# Patient Record
Sex: Female | Born: 1985 | Race: Black or African American | Hispanic: No | Marital: Single | Smoking: Never smoker
Health system: Southern US, Community
[De-identification: ages and names within clinical notes are randomized; demographics above are authoritative.]

## PROBLEM LIST (undated history)

## (undated) DIAGNOSIS — K219 Gastro-esophageal reflux disease without esophagitis: Secondary | ICD-10-CM

## (undated) DIAGNOSIS — K589 Irritable bowel syndrome without diarrhea: Secondary | ICD-10-CM

## (undated) DIAGNOSIS — D649 Anemia, unspecified: Secondary | ICD-10-CM

## (undated) HISTORY — PX: UPPER GASTROINTESTINAL ENDOSCOPY: SHX188

## (undated) HISTORY — DX: Irritable bowel syndrome without diarrhea: K58.9

## (undated) HISTORY — DX: Gastro-esophageal reflux disease without esophagitis: K21.9

## (undated) HISTORY — DX: Anemia, unspecified: D64.9

---

## 2006-11-24 DIAGNOSIS — C8590 Non-Hodgkin lymphoma, unspecified, unspecified site: Secondary | ICD-10-CM

## 2006-11-24 HISTORY — DX: Non-Hodgkin lymphoma, unspecified, unspecified site: C85.90

## 2020-06-15 ENCOUNTER — Encounter: Payer: Self-pay | Admitting: Gastroenterology

## 2020-07-24 ENCOUNTER — Encounter: Payer: Self-pay | Admitting: Gastroenterology

## 2020-07-24 ENCOUNTER — Ambulatory Visit (INDEPENDENT_AMBULATORY_CARE_PROVIDER_SITE_OTHER): Payer: BC Managed Care – PPO | Admitting: Gastroenterology

## 2020-07-24 VITALS — BP 132/84 | HR 79 | Ht 63.5 in | Wt 232.0 lb

## 2020-07-24 DIAGNOSIS — R112 Nausea with vomiting, unspecified: Secondary | ICD-10-CM

## 2020-07-24 DIAGNOSIS — R1013 Epigastric pain: Secondary | ICD-10-CM | POA: Insufficient documentation

## 2020-07-24 DIAGNOSIS — K219 Gastro-esophageal reflux disease without esophagitis: Secondary | ICD-10-CM

## 2020-07-24 NOTE — Progress Notes (Signed)
Assessment and plan noted ?

## 2020-07-24 NOTE — Progress Notes (Signed)
07/24/2020 Lynelle Doctor [id number] 09-14-1986   HISTORY OF PRESENT ILLNESS: This is a 34 year old female who is new to our office.  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  She is here today with complaints of epigastric abdominal pain with episodes of nausea and vomiting and burning sensation in her chest.  She says that this all started the beginning of March.  She says that she gets these episodes at least twice a month since then.  She says that she tried prescription for Prilosec earlier this year and it seemed like it was working for a while, but then symptoms started again.  She says that 2 weeks ago she was in the ER in [place], [place].  She says that she had a CT scan performed and she was told that it showed thickening of the lining of her stomach.  Currently she is only on Pepcid 20 mg twice daily.  She reports having some type of acid reflux issues in the past, greater than 10 years ago and had an endoscopy performed at that time, but from what she recalls it was unremarkable and she was not placed on any medications.  She denies NSAID use.   Past Medical History:  Diagnosis Date  . Anemia   . GERD (gastroesophageal reflux disease)   . IBS (irritable bowel syndrome)   . Non Hodgkin's lymphoma ([place]) 2008   Past Surgical History:  Procedure Laterality Date  . CESAREAN SECTION      reports that she has never smoked. She has never used smokeless tobacco. She reports that she does not use drugs. No history on file for alcohol use. family history includes Breast cancer in her maternal grandmother and mother; Diabetes in her mother; Heart disease in her father and paternal grandfather; Irritable bowel syndrome in her sister; Kidney disease in her father and maternal grandfather; Lung cancer in her paternal grandmother; Pancreatic cancer in her paternal aunt. No Known Allergies    Outpatient Encounter Medications as of 07/24/2020  Medication Sig  . famotidine (PEPCID) 20 MG tablet Take 20 mg  by mouth 2 (two) times daily.   No facility-administered encounter medications on file as of 07/24/2020.     REVIEW OF SYSTEMS  : All other systems reviewed and negative except where noted in the History of Present Illness.   PHYSICAL EXAM: BP 132/84   Pulse 79   Ht 5' 3.5" (1.613 m)   Wt 232 lb (105.2 kg)   BMI 40.45 kg/m  General: Well developed AA female in no acute distress Head: Normocephalic and atraumatic Eyes:  Sclerae anicteric, conjunctiva pink. Ears: Normal auditory acuity Lungs: Clear throughout to auscultation; no increased WOB. Heart: Regular rate and rhythm; no M/R/G. Abdomen: Soft, non-distended.  BS present.  Epigastric TTP. Musculoskeletal: Symmetrical with no gross deformities  Skin: No lesions on visible extremities Extremities: No edema  Neurological: Alert oriented x 4, grossly non-focal Psychological:  Alert and cooperative. Normal mood and affect  ASSESSMENT AND PLAN: *34 year old female with complaints of epigastric abdominal pain with episodes of nausea and vomiting as well as burning in her chest/reflux for the past 6 months.  No improvement on prescription Prilosec.  Now on Pepcid 20 mg twice daily.  Reports having a CT scan at [facility] in [place], [place].  She says she was told that it showed thickening of her stomach lining.  We will plan for EGD with Dr. Henrene Pastor.  She is scheduled for next week so she would prefer to hold off  on trying any other medications in the interim, but likely will need to try another PPI such as pantoprazole, etc.  The risks, benefits, and alternatives to EGD were discussed with the patient and she consents to proceed.   CC:  No ref. provider found

## 2020-07-24 NOTE — Patient Instructions (Addendum)
If you are age 36 or older, your body mass index should be between 23-30. Your Body mass index is 40.45 kg/m. If this is out of the aforementioned range listed, please consider follow up with your Primary Care Provider.  If you are age 44 or younger, your body mass index should be between 19-25. Your Body mass index is 40.45 kg/m. If this is out of the aformentioned range listed, please consider follow up with your Primary Care Provider.   You have been scheduled for an endoscopy. Please follow written instructions given to you at your visit today. If you use inhalers (even only as needed), please bring them with you on the day of your procedure.  Follow up pending your Endoscopy.

## 2020-07-31 ENCOUNTER — Other Ambulatory Visit: Payer: Self-pay

## 2020-07-31 ENCOUNTER — Ambulatory Visit (AMBULATORY_SURGERY_CENTER): Payer: BC Managed Care – PPO | Admitting: Internal Medicine

## 2020-07-31 ENCOUNTER — Encounter: Payer: Self-pay | Admitting: Internal Medicine

## 2020-07-31 VITALS — BP 126/82 | HR 84 | Temp 98.4°F | Resp 20 | Ht 63.0 in | Wt 232.0 lb

## 2020-07-31 DIAGNOSIS — K253 Acute gastric ulcer without hemorrhage or perforation: Secondary | ICD-10-CM | POA: Diagnosis not present

## 2020-07-31 DIAGNOSIS — K219 Gastro-esophageal reflux disease without esophagitis: Secondary | ICD-10-CM | POA: Diagnosis not present

## 2020-07-31 DIAGNOSIS — R112 Nausea with vomiting, unspecified: Secondary | ICD-10-CM

## 2020-07-31 DIAGNOSIS — R1013 Epigastric pain: Secondary | ICD-10-CM

## 2020-07-31 DIAGNOSIS — K449 Diaphragmatic hernia without obstruction or gangrene: Secondary | ICD-10-CM | POA: Diagnosis not present

## 2020-07-31 DIAGNOSIS — K222 Esophageal obstruction: Secondary | ICD-10-CM | POA: Diagnosis not present

## 2020-07-31 MED ORDER — SODIUM CHLORIDE 0.9 % IV SOLN: 500.0000 mL | Freq: Once | INTRAVENOUS | Status: DC

## 2020-07-31 MED ORDER — PANTOPRAZOLE SODIUM 40 MG PO TBEC: 40.0000 mg | DELAYED_RELEASE_TABLET | Freq: Two times a day (BID) | ORAL | 11 refills | Status: DC

## 2020-07-31 NOTE — Progress Notes (Signed)
Pt reported a slight pain and tingling on her bottom lip. RN assessed and there was a small open are on her lip that looked to be a tooth mark. MD evaluated the area as well and told the pt it did appear that she may have bit her lip during the procedure. No further actions needed at this time. Pt stable for discharge.

## 2020-07-31 NOTE — Progress Notes (Signed)
VS taken by C.W. 

## 2020-07-31 NOTE — Patient Instructions (Signed)
Handouts provided on gastritis and hiatal hernia.   Follow reflux precautions (see hiatal hernia handout).  Start Pantoprazole 40mg  tablet twice daily. (Stop taking Pepcid.)  My office will schedule an abdominal ultrasound to evaluate your recurrent epigastric pain with nausea and vomiting. We will contact you with the appointment details.  Office follow-up with Dr. Henrene Pastor in 4 to 6 weeks.   YOU HAD AN ENDOSCOPIC PROCEDURE TODAY AT [facility]:   Refer to the procedure report that was given to you for any specific questions about what was found during the examination.  If the procedure report does not answer your questions, please call your gastroenterologist to clarify.  If you requested that your care partner not be given the details of your procedure findings, then the procedure report has been included in a sealed envelope for you to review at your convenience later.  YOU SHOULD EXPECT: Some feelings of bloating in the abdomen. Passage of more gas than usual.  Walking can help get rid of the air that was put into your GI tract during the procedure and reduce the bloating. If you had a lower endoscopy (such as a colonoscopy or flexible sigmoidoscopy) you may notice spotting of blood in your stool or on the toilet paper. If you underwent a bowel prep for your procedure, you may not have a normal bowel movement for a few days.  Please Note:  You might notice some irritation and congestion in your nose or some drainage.  This is from the oxygen used during your procedure.  There is no need for concern and it should clear up in a day or so.  SYMPTOMS TO REPORT IMMEDIATELY:   Following upper endoscopy (EGD)  Vomiting of blood or coffee ground material  New chest pain or pain under the shoulder blades  Painful or persistently difficult swallowing  New shortness of breath  Fever of 100F or higher  Black, tarry-looking stools  For urgent or emergent issues, a gastroenterologist  can be reached at any hour by calling [phone]. Do not use MyChart messaging for urgent concerns.    DIET:  We do recommend a small meal at first, but then you may proceed to your regular diet.  Drink plenty of fluids but you should avoid alcoholic beverages for 24 hours.  ACTIVITY:  You should plan to take it easy for the rest of today and you should NOT DRIVE or use heavy machinery until tomorrow (because of the sedation medicines used during the test).    FOLLOW UP: Our staff will call the number listed on your records 48-72 hours following your procedure to check on you and address any questions or concerns that you may have regarding the information given to you following your procedure. If we do not reach you, we will leave a message.  We will attempt to reach you two times.  During this call, we will ask if you have developed any symptoms of COVID 19. If you develop any symptoms (ie: fever, flu-like symptoms, shortness of breath, cough etc.) before then, please call [phone].  If you test positive for Covid 19 in the 2 weeks post procedure, please call and report this information to [place].    If any biopsies were taken you will be contacted by phone or by letter within the next 1-3 weeks.  Please call us at [phone] if you have not heard about the biopsies in 3 weeks.    SIGNATURES/CONFIDENTIALITY: You and/or your care partner  have signed paperwork which will be entered into your electronic medical record.  These signatures attest to the fact that that the information above on your After Visit Summary has been reviewed and is understood.  Full responsibility of the confidentiality of this discharge information lies with you and/or your care-partner.

## 2020-07-31 NOTE — Op Note (Signed)
[facility] Patient Name: Betty Buckley Procedure Date: 07/31/2020 11:12 AM MRN: [id number] Endoscopist: Docia Chuck. Betty Buckley , MD Age: 34 Referring MD:  Date of Birth: 1986/04/10 Gender: Female Account #: [id number] Procedure:                Upper GI endoscopy with biopsies Indications:              Epigastric abdominal pain, Heartburn, Nausea with                            vomiting Medicines:                Monitored Anesthesia Care Procedure:                Pre-Anesthesia Assessment:                           - Prior to the procedure, a History and Physical                            was performed, and patient medications and                            allergies were reviewed. The patient's tolerance of                            previous anesthesia was also reviewed. The risks                            and benefits of the procedure and the sedation                            options and risks were discussed with the patient.                            All questions were answered, and informed consent                            was obtained. Prior Anticoagulants: The patient has                            taken no previous anticoagulant or antiplatelet                            agents. ASA Grade Assessment: II - A patient with                            mild systemic disease. After reviewing the risks                            and benefits, the patient was deemed in                            satisfactory condition to undergo the procedure.  After obtaining informed consent, the endoscope was                            passed under direct vision. Throughout the                            procedure, the patient's blood pressure, pulse, and                            oxygen saturations were monitored continuously. The                            Endoscope was introduced through the mouth, and                            advanced to the second part of  duodenum. The upper                            GI endoscopy was accomplished without difficulty.                            The patient tolerated the procedure well. Scope In: Scope Out: Findings:                 The esophagus revealed a large caliber distal                            esophageal ring. Esophagus was otherwise normal.                           The stomach revealed a moderate sliding hiatal                            hernia. As well, several antral erosions. Biopsies                            were taken with a cold forceps for Helicobacter                            pylori testing using CLOtest.                           The examined duodenum was normal.                           The cardia and gastric fundus were normal on                            retroflexion, save hiatal hernia. Complications:            No immediate complications. Estimated Blood Loss:     Estimated blood loss: none. Impression:               1. Distal esophageal ring  2. Moderate hiatal hernia                           3. Gastric erosions. Status post CLO biopsy                           4. GERD                           5. Recurrent epigastric pain with nausea vomiting. Recommendation:           1. Patient has a contact number available for                            emergencies. The signs and symptoms of potential                            delayed complications were discussed with the                            patient. Return to normal activities tomorrow.                            Written discharge instructions were provided to the                            patient.                           2. Resume previous diet.                           3. Prescribe pantoprazole 40 mg twice daily; #60;                            11 refills                           4. Reflux precautions. The nurse will provide you                            information on gastroesophageal  reflux disease as                            well as reflux precautions, for your review.                           5. Await pathology results.                           6. Schedule abdominal ultrasound. "Recurrent                            epigastric pain with nausea and vomiting, evaluate"                           7. Office follow-up with Dr.  Mattix Imhof in 4 to 6 weeks Betty Lottes N. Betty Pastor, MD 07/31/2020 11:39:38 AM This report has been signed electronically.

## 2020-07-31 NOTE — Progress Notes (Signed)
Called to room to assist during endoscopic procedure.  Patient ID and intended procedure confirmed with present staff. Received instructions for my participation in the procedure from the performing physician.  

## 2020-07-31 NOTE — Progress Notes (Signed)
PT taken to PACU. Monitors in place. VSS. Report given to RN. 

## 2020-08-01 ENCOUNTER — Telehealth: Payer: Self-pay

## 2020-08-01 ENCOUNTER — Other Ambulatory Visit: Payer: Self-pay

## 2020-08-01 DIAGNOSIS — R1013 Epigastric pain: Secondary | ICD-10-CM

## 2020-08-01 DIAGNOSIS — R112 Nausea with vomiting, unspecified: Secondary | ICD-10-CM

## 2020-08-01 LAB — HELICOBACTER PYLORI SCREEN-BIOPSY: UREASE: NEGATIVE

## 2020-08-01 NOTE — Telephone Encounter (Signed)
Pt scheduled for [place] of abd at [facility] 08/09/20@9 :30am, pt to arrive here at 9:15am. Pt needs to be NPO after midnight. Pt scheduled for OV with Dr. Henrene Pastor 09/13/20@8 :40am. Pt aware.

## 2020-08-02 ENCOUNTER — Telehealth: Payer: Self-pay

## 2020-08-02 NOTE — Telephone Encounter (Signed)
Second attempt follow up call to pt, lm on vm. 

## 2020-08-02 NOTE — Telephone Encounter (Signed)
LVM

## 2020-08-09 ENCOUNTER — Other Ambulatory Visit: Payer: Self-pay

## 2020-08-09 ENCOUNTER — Ambulatory Visit (HOSPITAL_COMMUNITY)
Admission: RE | Admit: 2020-08-09 | Discharge: 2020-08-09 | Disposition: A | Discharge status: Home / Self Care | Payer: BC Managed Care – PPO | Source: Ambulatory Visit | Attending: Internal Medicine | Admitting: Internal Medicine

## 2020-08-09 DIAGNOSIS — R1013 Epigastric pain: Secondary | ICD-10-CM | POA: Insufficient documentation

## 2020-08-09 DIAGNOSIS — R112 Nausea with vomiting, unspecified: Secondary | ICD-10-CM | POA: Insufficient documentation

## 2020-09-13 ENCOUNTER — Ambulatory Visit: Payer: BC Managed Care – PPO | Admitting: Internal Medicine

## 2021-07-31 ENCOUNTER — Other Ambulatory Visit: Payer: Self-pay | Admitting: Internal Medicine

## 2021-07-31 DIAGNOSIS — K219 Gastro-esophageal reflux disease without esophagitis: Secondary | ICD-10-CM

## 2021-07-31 DIAGNOSIS — K253 Acute gastric ulcer without hemorrhage or perforation: Secondary | ICD-10-CM

## 2021-12-24 IMAGING — US US ABDOMEN COMPLETE
1 series · 14 of 25 positions shown · non-contrast
Comparison: None.

CLINICAL DATA: Epigastric pain

EXAM:
ABDOMEN ULTRASOUND COMPLETE

[Series 1: us abdomen complete · 14 of 64 slices shown]
[im 1/64]
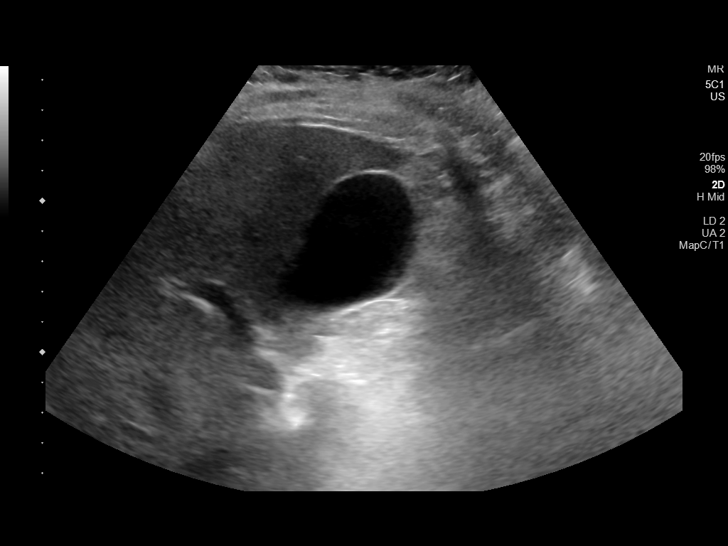
[im 6/64]
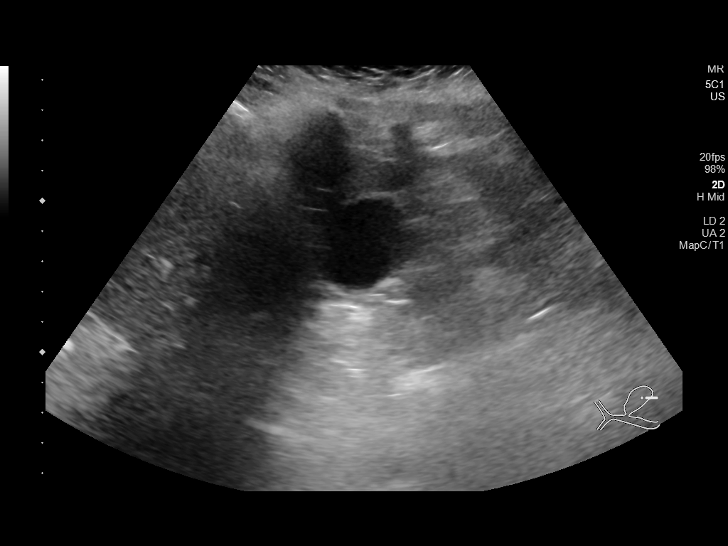
[im 11/64]
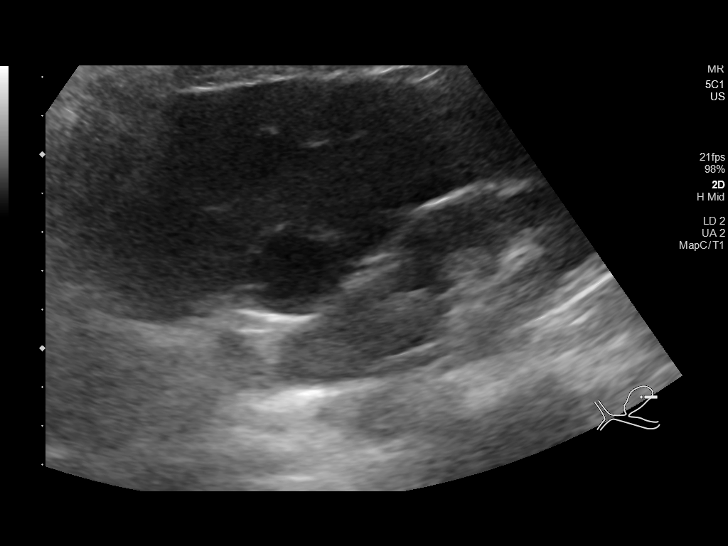
[im 16/64]
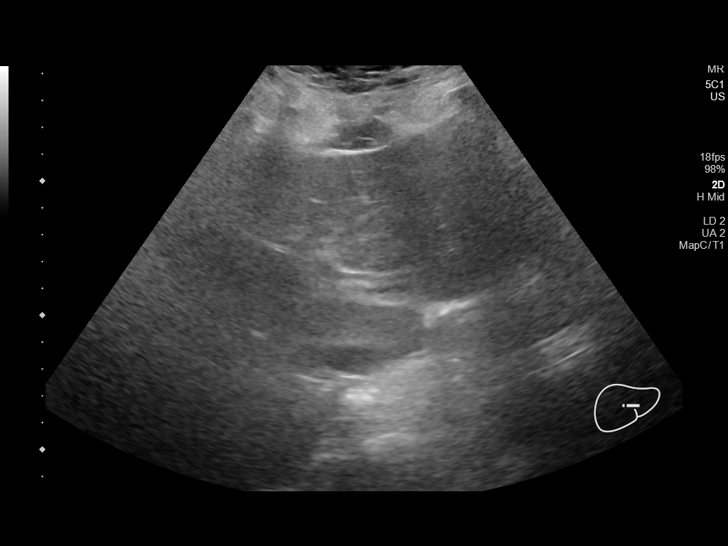
[im 22/64]
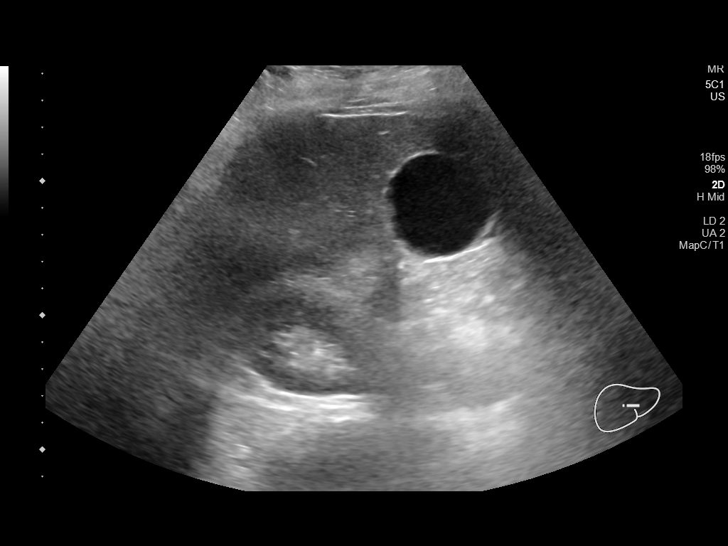
[im 24/64]
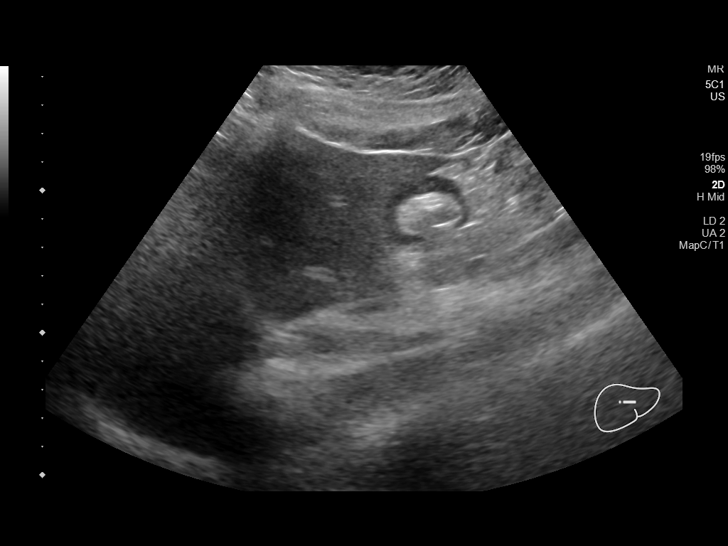
[im 29/64]
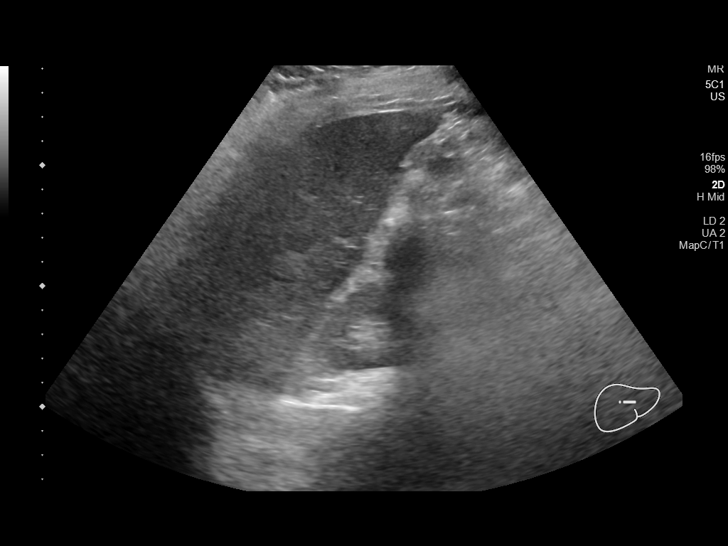
[im 35/64]
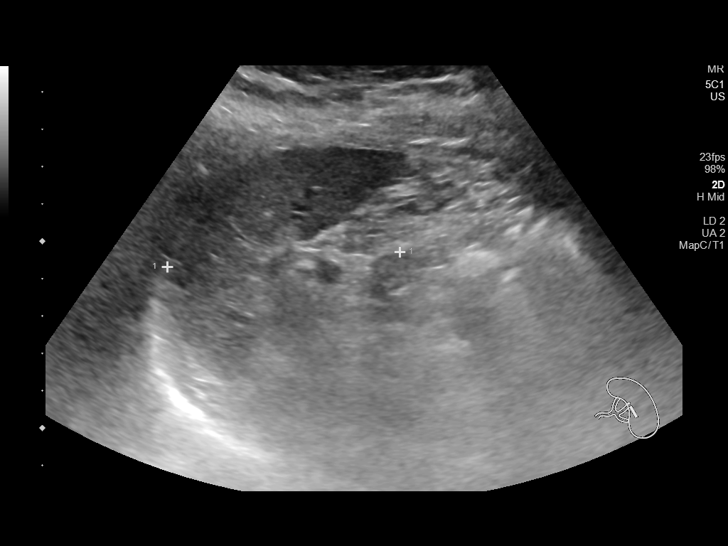
[im 40/64]
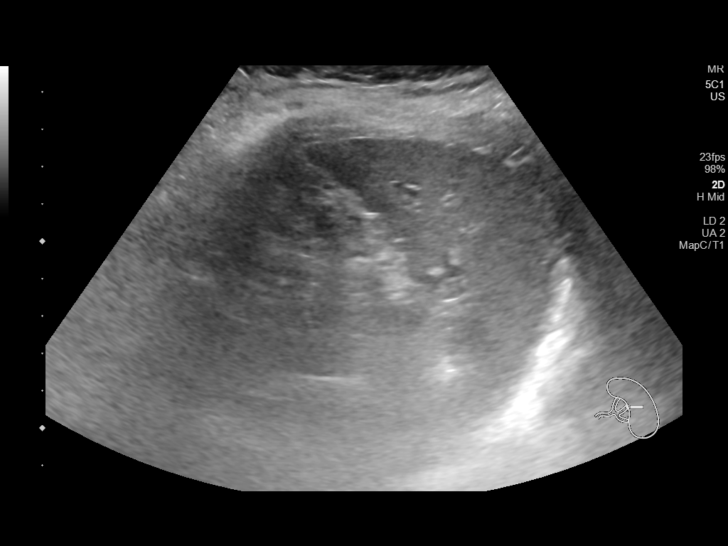
[im 43/64]
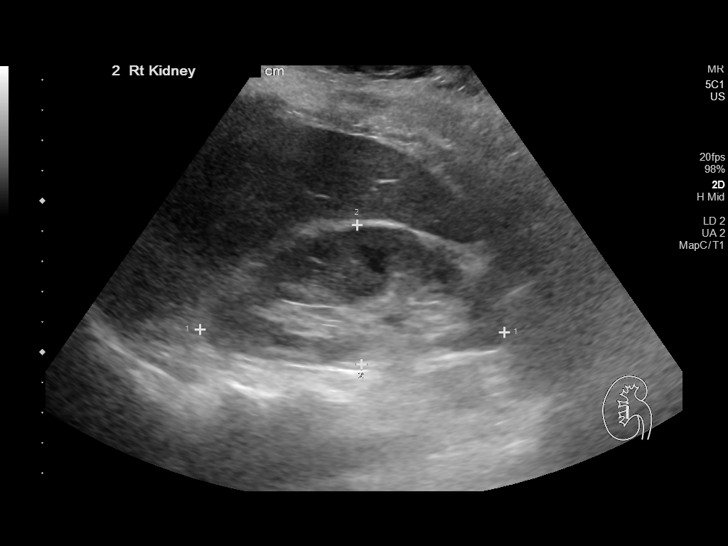
[im 48/64]
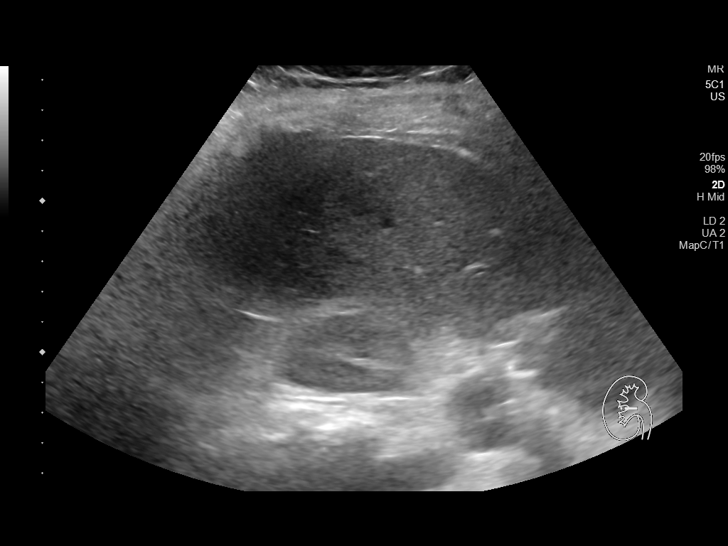
[im 53/64]
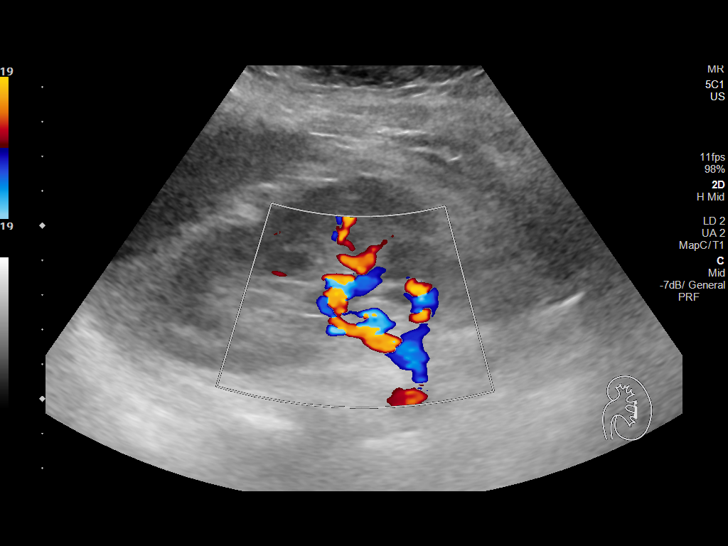
[im 58/64]
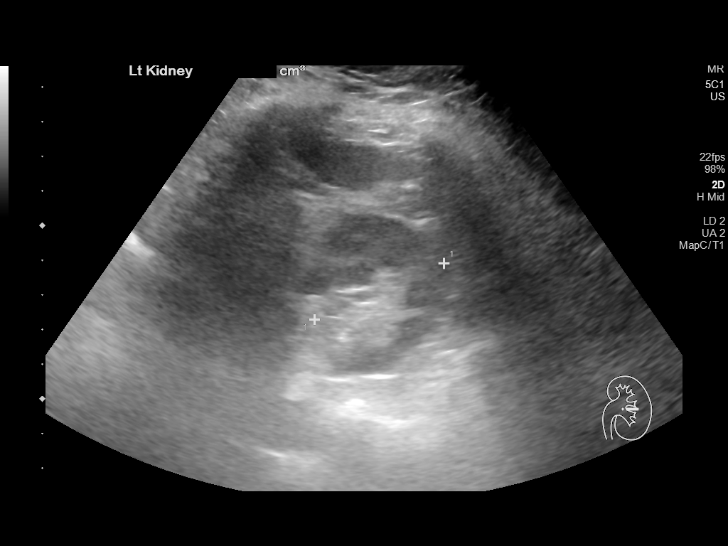
[im 64/64]
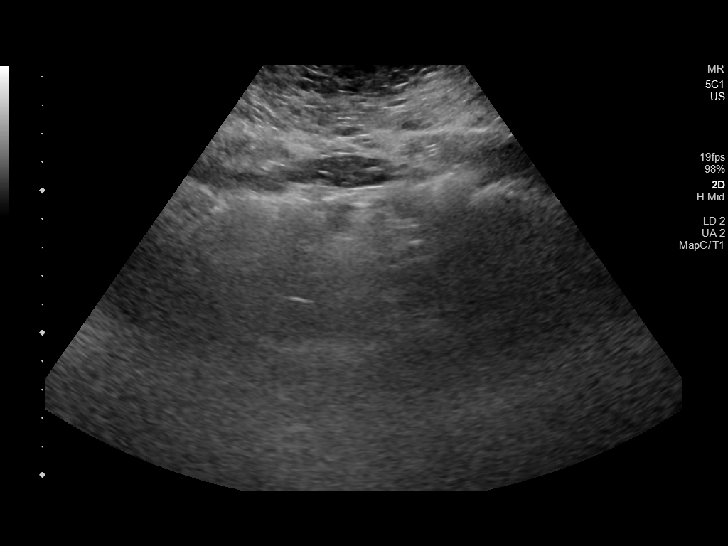

[14 of 25 positions shown; findings below may reference images not displayed]

FINDINGS: Gallbladder: No gallstones or wall thickening visualized. No
sonographic Murphy sign noted by sonographer.

Common bile duct: Diameter: 2.7 mm

Liver: No focal lesion identified. Within normal limits in
parenchymal echogenicity. Portal vein is patent on color Doppler
imaging with normal direction of blood flow towards the liver.

IVC: No abnormality visualized.

Pancreas: Largely obscured by bowel gas

Spleen: Size and appearance within normal limits.

Right Kidney: Length: 10.1 cm. Echogenicity within normal limits. No
mass or hydronephrosis visualized.

Left Kidney: Length: 12 cm. Echogenicity within normal limits. No
mass or hydronephrosis visualized.

Abdominal aorta: No aneurysm visualized.

Other findings: None.
IMPRESSION: Negative examination

## 2021-12-25 ENCOUNTER — Other Ambulatory Visit: Payer: Self-pay | Admitting: Internal Medicine

## 2021-12-25 DIAGNOSIS — K219 Gastro-esophageal reflux disease without esophagitis: Secondary | ICD-10-CM

## 2021-12-25 DIAGNOSIS — K253 Acute gastric ulcer without hemorrhage or perforation: Secondary | ICD-10-CM

## 2023-01-09 ENCOUNTER — Other Ambulatory Visit: Payer: Self-pay | Admitting: Internal Medicine

## 2023-01-09 DIAGNOSIS — K219 Gastro-esophageal reflux disease without esophagitis: Secondary | ICD-10-CM

## 2023-01-09 DIAGNOSIS — K253 Acute gastric ulcer without hemorrhage or perforation: Secondary | ICD-10-CM
